# Patient Record
Sex: Female | Born: 1980 | Race: Black or African American | Hispanic: No | Marital: Single | Smoking: Never smoker
Health system: Southern US, Community
[De-identification: ages and names within clinical notes are randomized; demographics above are authoritative.]

## PROBLEM LIST (undated history)

## (undated) DIAGNOSIS — Z8639 Personal history of other endocrine, nutritional and metabolic disease: Secondary | ICD-10-CM

## (undated) HISTORY — PX: LAPAROSCOPIC GASTRIC SLEEVE RESECTION: SHX5895

---

## 2016-10-24 ENCOUNTER — Other Ambulatory Visit: Payer: Self-pay | Admitting: Surgical Oncology

## 2016-11-01 ENCOUNTER — Ambulatory Visit: Payer: Self-pay

## 2016-11-08 ENCOUNTER — Encounter: Payer: Self-pay | Admitting: Radiology

## 2016-11-08 ENCOUNTER — Ambulatory Visit
Admission: RE | Admit: 2016-11-08 | Discharge: 2016-11-08 | Disposition: A | Discharge status: Home / Self Care | Payer: Medicaid Other | Source: Ambulatory Visit | Attending: Surgical Oncology | Admitting: Surgical Oncology

## 2016-11-08 DIAGNOSIS — K449 Diaphragmatic hernia without obstruction or gangrene: Secondary | ICD-10-CM | POA: Insufficient documentation

## 2016-11-08 DIAGNOSIS — K219 Gastro-esophageal reflux disease without esophagitis: Secondary | ICD-10-CM | POA: Insufficient documentation

## 2018-05-07 ENCOUNTER — Ambulatory Visit
Admission: EM | Admit: 2018-05-07 | Discharge: 2018-05-07 | Disposition: A | Discharge status: Home / Self Care | Payer: BLUE CROSS/BLUE SHIELD | Attending: Family Medicine | Admitting: Family Medicine

## 2018-05-07 ENCOUNTER — Other Ambulatory Visit: Payer: Self-pay

## 2018-05-07 DIAGNOSIS — M79645 Pain in left finger(s): Secondary | ICD-10-CM

## 2018-05-07 DIAGNOSIS — M6740 Ganglion, unspecified site: Secondary | ICD-10-CM

## 2018-05-07 NOTE — Discharge Instructions (Signed)
Tylenol or Ibuprofen as needed.  Take care  Dr. Lacinda Axon

## 2018-05-07 NOTE — ED Provider Notes (Signed)
[facility]    CSN: [id number] Arrival date & time: 05/07/18  1906  History   Chief Complaint Chief Complaint  Patient presents with  . Hand Pain   HPI  37 year old female presents with the above complaint.  Patient reports that she noted some pain and a "knot" around the base of her left thumb earlier today.  Mild pain with range of motion.  No medications or interventions tried.  No known inciting factor.  No known relieving factors.  No other associated symptoms.  No other complaints.   PMH: History of morbid obesity, history of hyperthyroidism.  Prediabetes, elevated HDL.  Past Surgical History:  Procedure Laterality Date  . LAPAROSCOPIC GASTRIC SLEEVE RESECTION    D & C  OB History   None    Home Medications    Prior to Admission medications   Medication Sig Start Date End Date Taking? Authorizing Provider  levonorgestrel (MIRENA) 20 MCG/24HR IUD by Intrauterine route.   Yes [provider]    Family History Family History  Problem Relation Age of Onset  . Hypertension Mother     Social History Social History   Tobacco Use  . Smoking status: Never Smoker  . Smokeless tobacco: Never Used  Substance Use Topics  . Alcohol use: Never    Frequency: Never  . Drug use: Never     Allergies   Patient has no known allergies.   Review of Systems Review of Systems  Constitutional: Negative.   Musculoskeletal:       Pain, "knot" left hand.   Physical Exam Triage Vital Signs ED Triage Vitals  Enc Vitals Group     BP 05/07/18 1919 110/74     Pulse Rate 05/07/18 1919 (!) 53     Resp 05/07/18 1919 18     Temp 05/07/18 1919 98.5 F (36.9 C)     Temp Source 05/07/18 1919 Oral     SpO2 05/07/18 1919 100 %     Weight 05/07/18 1918 192 lb (87.1 kg)     Height 05/07/18 1918 5\' 3"  (1.6 m)     Head Circumference --      Peak Flow --      Pain Score 05/07/18 1917 6     Pain Loc --      Pain Edu? --      Excl. in [place]? --    Updated  Vital Signs BP 110/74 (BP Location: Left Arm)   Pulse (!) 53   Temp 98.5 F (36.9 C) (Oral)   Resp 18   Ht 5\' 3"  (1.6 m)   Wt 192 lb (87.1 kg)   SpO2 100%   BMI 34.01 kg/m   Physical Exam  Constitutional: She is oriented to person, place, and time. She appears well-developed. No distress.  HENT:  Head: Normocephalic and atraumatic.  Pulmonary/Chest: Effort normal. No respiratory distress.  Musculoskeletal:  Left hand -patient with a raised nodule around the [facility] joint.   firm, mobile.  Consistent with ganglion cyst.  Neurological: She is alert and oriented to person, place, and time.  Psychiatric: She has a normal mood and affect. Her behavior is normal.  Nursing note and vitals reviewed.  UC Treatments / Results  Labs (all labs ordered are listed, but only abnormal results are displayed) Labs Reviewed - No data to display  EKG None  Radiology No results found.  Procedures Procedures (including critical care time)  Medications Ordered in UC Medications - No data to display  Initial Impression / Assessment and Plan / UC Course  I have reviewed the triage vital signs and the nursing notes.  Pertinent labs & imaging results that were available during my care of the patient were reviewed by me and considered in my medical decision making (see chart for details).    37 year old female presents with a ganglion cyst.  Advised supportive care.  Tylenol and ibuprofen as needed.  Final Clinical Impressions(s) / UC Diagnoses   Final diagnoses:  Ganglion cyst     Discharge Instructions     Tylenol or Ibuprofen as needed.  Take care  Dr. Lacinda Axon    ED Prescriptions    None     Controlled Substance Prescriptions [place] Controlled Substance Registry consulted? Not Applicable   Coral Spikes, DO 05/07/18 1948

## 2018-05-07 NOTE — ED Triage Notes (Signed)
Patient complains of knot on her left hand that started around lunchtime and is sore with movement.

## 2018-05-31 ENCOUNTER — Ambulatory Visit: Payer: BLUE CROSS/BLUE SHIELD | Admitting: Anesthesiology

## 2018-05-31 ENCOUNTER — Encounter
Admission: RE | Disposition: A | Discharge status: Home / Self Care | Payer: Self-pay | Source: Ambulatory Visit | Attending: Unknown Physician Specialty

## 2018-05-31 ENCOUNTER — Ambulatory Visit
Admission: RE | Admit: 2018-05-31 | Discharge: 2018-05-31 | Disposition: A | Discharge status: Home / Self Care | Payer: BLUE CROSS/BLUE SHIELD | Source: Ambulatory Visit | Attending: Unknown Physician Specialty | Admitting: Unknown Physician Specialty

## 2018-05-31 DIAGNOSIS — Z6833 Body mass index (BMI) 33.0-33.9, adult: Secondary | ICD-10-CM | POA: Insufficient documentation

## 2018-05-31 DIAGNOSIS — Z79899 Other long term (current) drug therapy: Secondary | ICD-10-CM | POA: Diagnosis not present

## 2018-05-31 DIAGNOSIS — D573 Sickle-cell trait: Secondary | ICD-10-CM | POA: Insufficient documentation

## 2018-05-31 DIAGNOSIS — R2232 Localized swelling, mass and lump, left upper limb: Secondary | ICD-10-CM | POA: Diagnosis present

## 2018-05-31 DIAGNOSIS — Z9884 Bariatric surgery status: Secondary | ICD-10-CM | POA: Insufficient documentation

## 2018-05-31 DIAGNOSIS — D492 Neoplasm of unspecified behavior of bone, soft tissue, and skin: Secondary | ICD-10-CM | POA: Diagnosis not present

## 2018-05-31 HISTORY — DX: Personal history of other endocrine, nutritional and metabolic disease: Z86.39

## 2018-05-31 HISTORY — PX: MASS EXCISION: SHX2000

## 2018-05-31 SURGERY — EXCISION MASS
Anesthesia: General | Site: Hand | Laterality: Left | Wound class: Clean

## 2018-05-31 MED ORDER — ACETAMINOPHEN 160 MG/5ML PO SOLN: 325.0000 mg | ORAL | Status: DC | PRN

## 2018-05-31 MED ORDER — ONDANSETRON HCL 4 MG/2ML IJ SOLN: 4.0000 mg | Freq: Once | INTRAMUSCULAR | Status: DC | PRN

## 2018-05-31 MED ORDER — PROPOFOL 10 MG/ML IV BOLUS
INTRAVENOUS | Status: DC | PRN
  Administered 2018-05-31: 200 mg via INTRAVENOUS

## 2018-05-31 MED ORDER — LIDOCAINE HCL (CARDIAC) PF 100 MG/5ML IV SOSY
PREFILLED_SYRINGE | INTRAVENOUS | Status: DC | PRN
  Administered 2018-05-31: 30 mg via INTRATRACHEAL

## 2018-05-31 MED ORDER — BUPIVACAINE HCL 0.5 % IJ SOLN
INTRAMUSCULAR | Status: DC | PRN
  Administered 2018-05-31: 5 mL

## 2018-05-31 MED ORDER — OXYCODONE HCL 5 MG/5ML PO SOLN: 5.0000 mg | Freq: Once | ORAL | Status: AC | PRN

## 2018-05-31 MED ORDER — HYDROMORPHONE HCL 1 MG/ML IJ SOLN: 0.2500 mg | INTRAMUSCULAR | Status: DC | PRN

## 2018-05-31 MED ORDER — DEXAMETHASONE SODIUM PHOSPHATE 4 MG/ML IJ SOLN
INTRAMUSCULAR | Status: DC | PRN
  Administered 2018-05-31: 4 mg via INTRAVENOUS

## 2018-05-31 MED ORDER — MIDAZOLAM HCL 5 MG/5ML IJ SOLN
INTRAMUSCULAR | Status: DC | PRN
  Administered 2018-05-31 (×2): 1 mg via INTRAVENOUS

## 2018-05-31 MED ORDER — ONDANSETRON HCL 4 MG/2ML IJ SOLN
INTRAMUSCULAR | Status: DC | PRN
  Administered 2018-05-31: 4 mg via INTRAVENOUS

## 2018-05-31 MED ORDER — OXYCODONE HCL 5 MG PO TABS
5.0000 mg | ORAL_TABLET | Freq: Once | ORAL | Status: AC | PRN
  Administered 2018-05-31: 5 mg via ORAL

## 2018-05-31 MED ORDER — NORCO 5-325 MG PO TABS: 1.0000 | ORAL_TABLET | Freq: Four times a day (QID) | ORAL | 0 refills | Status: AC | PRN

## 2018-05-31 MED ORDER — FENTANYL CITRATE (PF) 100 MCG/2ML IJ SOLN
INTRAMUSCULAR | Status: DC | PRN
  Administered 2018-05-31 (×2): 25 ug via INTRAVENOUS
  Administered 2018-05-31: 50 ug via INTRAVENOUS

## 2018-05-31 MED ORDER — ACETAMINOPHEN 325 MG PO TABS: 325.0000 mg | ORAL_TABLET | ORAL | Status: DC | PRN

## 2018-05-31 MED ORDER — GLYCOPYRROLATE 0.2 MG/ML IJ SOLN
INTRAMUSCULAR | Status: DC | PRN
  Administered 2018-05-31: 0.1 mg via INTRAVENOUS

## 2018-05-31 MED ORDER — LACTATED RINGERS IV SOLN
INTRAVENOUS | Status: DC
  Administered 2018-05-31: 08:00:00 via INTRAVENOUS

## 2018-05-31 SURGICAL SUPPLY — 28 items
BANDAGE ELASTIC 2 LF NS (GAUZE/BANDAGES/DRESSINGS) ×3 IMPLANT
BNDG COHESIVE 4X5 TAN STRL (GAUZE/BANDAGES/DRESSINGS) ×3 IMPLANT
BNDG ESMARK 4X12 TAN STRL LF (GAUZE/BANDAGES/DRESSINGS) ×3 IMPLANT
CANISTER SUCT 1200ML W/VALVE (MISCELLANEOUS) IMPLANT
COVER LIGHT HANDLE UNIVERSAL (MISCELLANEOUS) ×6 IMPLANT
DURAPREP 26ML APPLICATOR (WOUND CARE) ×3 IMPLANT
ELECT REM PT RETURN 9FT ADLT (ELECTROSURGICAL) ×3
ELECTRODE REM PT RTRN 9FT ADLT (ELECTROSURGICAL) ×1 IMPLANT
GAUZE PETRO XEROFOAM 1X8 (MISCELLANEOUS) ×3 IMPLANT
GAUZE SPONGE 4X4 12PLY STRL (GAUZE/BANDAGES/DRESSINGS) ×3 IMPLANT
GLOVE BIO SURGEON STRL SZ7.5 (GLOVE) ×6 IMPLANT
GLOVE BIO SURGEON STRL SZ8 (GLOVE) ×3 IMPLANT
GLOVE INDICATOR 8.0 STRL GRN (GLOVE) ×6 IMPLANT
GOWN STRL REUS W/ TWL LRG LVL3 (GOWN DISPOSABLE) ×2 IMPLANT
GOWN STRL REUS W/TWL LRG LVL3 (GOWN DISPOSABLE) ×4
HEMOSTAT SURGICEL 2X3 (HEMOSTASIS) ×3 IMPLANT
KIT TURNOVER KIT A (KITS) ×3 IMPLANT
NS IRRIG 500ML POUR BTL (IV SOLUTION) ×3 IMPLANT
PACK EXTREMITY ARMC (MISCELLANEOUS) ×3 IMPLANT
PADDING CAST 2X4YD ST (MISCELLANEOUS) ×2
PADDING CAST BLEND 2X4 STRL (MISCELLANEOUS) ×1 IMPLANT
PENCIL SMOKE EVACUATOR (MISCELLANEOUS) ×3 IMPLANT
STOCKINETTE IMPERVIOUS 9X36 MD (GAUZE/BANDAGES/DRESSINGS) ×3 IMPLANT
SUT ETHILON 3-0 FS-10 30 BLK (SUTURE)
SUT ETHILON 4 0 FS (SUTURE) ×3 IMPLANT
SUT VIC AB 3-0 SH 27 (SUTURE) ×2
SUT VIC AB 3-0 SH 27X BRD (SUTURE) ×1 IMPLANT
SUTURE EHLN 3-0 FS-10 30 BLK (SUTURE) IMPLANT

## 2018-05-31 NOTE — Transfer of Care (Signed)
Immediate Anesthesia Transfer of Care Note  Patient: Alexa Baker  Procedure(s) Performed: EXCISION MASS  HAND (Left Hand)  Patient Location: PACU  Anesthesia Type: General  Level of Consciousness: awake, alert  and patient cooperative  Airway and Oxygen Therapy: Patient Spontanous Breathing and Patient connected to supplemental oxygen  Post-op Assessment: Post-op Vital signs reviewed, Patient's Cardiovascular Status Stable, Respiratory Function Stable, Patent Airway and No signs of Nausea or vomiting  Post-op Vital Signs: Reviewed and stable  Complications: No apparent anesthesia complications

## 2018-05-31 NOTE — Anesthesia Preprocedure Evaluation (Addendum)
Anesthesia Evaluation  Patient identified by MRN, date of birth, ID band Patient awake    Reviewed: Allergy & Precautions, H&P , NPO status , Patient's Chart, lab work & pertinent test results, reviewed documented beta blocker date and time   Airway Mallampati: I  TM Distance: >3 FB Neck ROM: full    Dental no notable dental hx.    Pulmonary neg pulmonary ROS,    Pulmonary exam normal breath sounds clear to auscultation       Cardiovascular Exercise Tolerance: Good negative cardio ROS Normal cardiovascular exam Rhythm:regular Rate:Normal     Neuro/Psych negative neurological ROS  negative psych ROS   GI/Hepatic negative GI ROS, Neg liver ROS,   Endo/Other  negative endocrine ROS  Renal/GU negative Renal ROS  negative genitourinary   Musculoskeletal   Abdominal   Peds  Hematology negative hematology ROS (+)   Anesthesia Other Findings   Reproductive/Obstetrics negative OB ROS                             Anesthesia Physical Anesthesia Plan  ASA: II  Anesthesia Plan: General   Post-op Pain Management:    Induction:   PONV Risk Score and Plan:   Airway Management Planned:   Additional Equipment:   Intra-op Plan:   Post-operative Plan:   Informed Consent: I have reviewed the patients History and Physical, chart, labs and discussed the procedure including the risks, benefits and alternatives for the proposed anesthesia with the patient or authorized representative who has indicated his/her understanding and acceptance.   Dental Advisory Given  Plan Discussed with: CRNA and Anesthesiologist  Anesthesia Plan Comments:        Anesthesia Quick Evaluation

## 2018-05-31 NOTE — Anesthesia Postprocedure Evaluation (Signed)
Anesthesia Post Note  Patient: Alexa Baker  Procedure(s) Performed: EXCISION MASS  HAND (Left Hand)  Patient location during evaluation: PACU Anesthesia Type: General Level of consciousness: awake and alert Pain management: pain level controlled Vital Signs Assessment: post-procedure vital signs reviewed and stable Respiratory status: spontaneous breathing, nonlabored ventilation, respiratory function stable and patient connected to nasal cannula oxygen Cardiovascular status: blood pressure returned to baseline and stable Postop Assessment: no apparent nausea or vomiting Anesthetic complications: no    Trecia Rogers

## 2018-05-31 NOTE — Discharge Instructions (Signed)
Ice pack  Elevation  RTC in about 10 days    General Anesthesia, Adult, Care After These instructions provide you with information about caring for yourself after your procedure. Your health care provider may also give you more specific instructions. Your treatment has been planned according to current medical practices, but problems sometimes occur. Call your health care provider if you have any problems or questions after your procedure. What can I expect after the procedure? After the procedure, it is common to have:  Vomiting.  A sore throat.  Mental slowness.  It is common to feel:  Nauseous.  Cold or shivery.  Sleepy.  Tired.  Sore or achy, even in parts of your body where you did not have surgery.  Follow these instructions at home: For at least 24 hours after the procedure:  Do not: ? Participate in activities where you could fall or become injured. ? Drive. ? Use heavy machinery. ? Drink alcohol. ? Take sleeping pills or medicines that cause drowsiness. ? Make important decisions or sign legal documents. ? Take care of children on your own.  Rest. Eating and drinking  If you vomit, drink water, juice, or soup when you can drink without vomiting.  Drink enough fluid to keep your urine clear or pale yellow.  Make sure you have little or no nausea before eating solid foods.  Follow the diet recommended by your health care provider. General instructions  Have a responsible adult stay with you until you are awake and alert.  Return to your normal activities as told by your health care provider. Ask your health care provider what activities are safe for you.  Take over-the-counter and prescription medicines only as told by your health care provider.  If you smoke, do not smoke without supervision.  Keep all follow-up visits as told by your health care provider. This is important. Contact a health care provider if:  You continue to have nausea or  vomiting at home, and medicines are not helpful.  You cannot drink fluids or start eating again.  You cannot urinate after 8-12 hours.  You develop a skin rash.  You have fever.  You have increasing redness at the site of your procedure. Get help right away if:  You have difficulty breathing.  You have chest pain.  You have unexpected bleeding.  You feel that you are having a life-threatening or urgent problem. This information is not intended to replace advice given to you by your health care provider. Make sure you discuss any questions you have with your health care provider. Document Released: 02/26/2001 Document Revised: 04/24/2016 Document Reviewed: 11/04/2015 Elsevier Interactive Patient Education  [employer].

## 2018-05-31 NOTE — Op Note (Signed)
05/31/2018  10:19 AM  PATIENT:  Alexa Baker  37 y.o. female  PRE-OPERATIVE DIAGNOSIS:  MASS left thumb  POST-OPERATIVE DIAGNOSIS:  MASS left thumb2  PROCEDURE:  Procedure(s): EXCISION MASS  HAND (Left)  SURGEON:   Leanor Kail, Brooke Bonito., MD  ANESTHESIA: General  HISTORY: The patient had about a 4-week history of an enlarging mass on the dorsum of her left thumb.   She was brought in for excision of the mass under general anesthesia.  OP NOTE: Patient was taken to the operating room where satisfactory general anesthesia was achieved.  Tourniquet was applied to the left upper arm.  The left upper extremity was prepped and draped in usual fashion for procedure about the hand.  The left upper extremity was exsanguinated and the tourniquet was inflated.  About a 2-1/2 cm transverse incision was made over the midportion of the mass.  The mass was located just dorsal to the [facility] joint of the left thumb.  I bluntly dissected down to the mass.  The mass was somewhat ill-defined.  There was some nodularity to it but it also was spread in a linear fashion for about 2 cm.  It did seem to be intertwined with some of the small vessels overlying the left thumb [facility] joint.  I  was able to gradually sharply dissect the mass away from its soft tissue entanglements.  Once I felt the mass was completely excised the tourniquet was released.  It was up about 20 minutes.  Bleeding was controlled with coagulation cautery.  The subcu was closed with 3-0 Vicryl.  The skin was closed with 4-0 nylon sutures in vertical mattress fashion.  I did inject about 5 cc of half percent Marcaine without epinephrine about the incision and just proximal to it.  Dressing was applied.  The patient was awakened and transferred to her stretcher bed.  She was taken to the recovery room in satisfactory condition.  Blood loss was negligible.

## 2018-05-31 NOTE — Anesthesia Procedure Notes (Addendum)
Procedure Name: LMA Insertion Date/Time: 05/31/2018 8:57 AM Performed by: Cameron Ali, CRNA Pre-anesthesia Checklist: Patient identified, Emergency Drugs available, Suction available, Timeout performed and Patient being monitored Patient Re-evaluated:Patient Re-evaluated prior to induction Oxygen Delivery Method: Circle system utilized Preoxygenation: Pre-oxygenation with 100% oxygen Induction Type: IV induction LMA: LMA inserted LMA Size: 4.0 Number of attempts: 1 Placement Confirmation: positive ETCO2 and breath sounds checked- equal and bilateral Tube secured with: Tape Dental Injury: Teeth and Oropharynx as per pre-operative assessment

## 2018-05-31 NOTE — H&P (Signed)
  H and P reviewed. No changes. Uploaded at later date. 

## 2018-06-04 ENCOUNTER — Encounter: Payer: Self-pay | Admitting: Unknown Physician Specialty

## 2018-06-10 LAB — SURGICAL PATHOLOGY

## 2019-01-22 ENCOUNTER — Other Ambulatory Visit: Payer: Self-pay

## 2019-01-22 ENCOUNTER — Encounter: Payer: Self-pay | Admitting: Emergency Medicine

## 2019-01-22 ENCOUNTER — Ambulatory Visit
Admission: EM | Admit: 2019-01-22 | Discharge: 2019-01-22 | Disposition: A | Discharge status: Home / Self Care | Payer: Managed Care, Other (non HMO) | Attending: Family Medicine | Admitting: Family Medicine

## 2019-01-22 DIAGNOSIS — M542 Cervicalgia: Secondary | ICD-10-CM

## 2019-01-22 MED ORDER — METAXALONE 800 MG PO TABS: 800.0000 mg | ORAL_TABLET | Freq: Three times a day (TID) | ORAL | 0 refills | Status: DC | PRN

## 2019-01-22 MED ORDER — MELOXICAM 15 MG PO TABS: 15.0000 mg | ORAL_TABLET | Freq: Every day | ORAL | 0 refills | Status: DC | PRN

## 2019-01-22 NOTE — ED Triage Notes (Signed)
Pt c/o left shoulder pain and neck pain. Started about  2 weeks ago. She states that it is worse when she lays down. No known injury. She has tried heating pad and tylenol.

## 2019-01-22 NOTE — Discharge Instructions (Signed)
Rest.   Heat/Hot bath.  Medication as prescribed.  Take care  Dr. Lacinda Axon

## 2019-01-22 NOTE — ED Provider Notes (Signed)
[facility]    CSN: [id number] Arrival date & time: 01/22/19  9767  History   Chief Complaint Chief Complaint  Patient presents with  . Neck Pain  . Shoulder Pain    left   HPI  38 year old female presents with the above complaints.  Patient reports that she has had left-sided neck pain/trapezius pain for the past 2 weeks.  Has been steadily getting worse.  She is tried over-the-counter medications without improvement.  Worse with range of motion particularly and left rotation.  Denies fall, trauma, injury.  No known relieving factors.  No radicular symptoms.  No other complaints.  PMH, Surgical Hx, Family Hx, Social History reviewed and updated as below.  Past Medical History:  Diagnosis Date  . History of hyperthyroidism    Past Surgical History:  Procedure Laterality Date  . LAPAROSCOPIC GASTRIC SLEEVE RESECTION    . MASS EXCISION Left 05/31/2018   Procedure: EXCISION MASS  HAND;  Surgeon: Leanor Kail, MD;  Location: [place];  Service: Orthopedics;  Laterality: Left;   OB History   No obstetric history on file.    Home Medications    Prior to Admission medications   Medication Sig Start Date End Date Taking? Authorizing Provider  levonorgestrel (MIRENA) 20 MCG/24HR IUD by Intrauterine route.   Yes [provider]  meloxicam (MOBIC) 15 MG tablet Take 1 tablet (15 mg total) by mouth daily as needed for pain. 01/22/19   Coral Spikes, DO  metaxalone (SKELAXIN) 800 MG tablet Take 1 tablet (800 mg total) by mouth 3 (three) times daily as needed for muscle spasms. 01/22/19   Coral Spikes, DO   Family History Family History  Problem Relation Age of Onset  . Hypertension Mother    Social History Social History   Tobacco Use  . Smoking status: Never Smoker  . Smokeless tobacco: Never Used  Substance Use Topics  . Alcohol use: Never    Frequency: Never  . Drug use: Never   Allergies   Patient has no known allergies.  Review  of Systems Review of Systems  Constitutional: Negative.   Musculoskeletal: Positive for neck pain.   Physical Exam Triage Vital Signs ED Triage Vitals  Enc Vitals Group     BP 01/22/19 1017 104/73     Pulse Rate 01/22/19 1017 (!) 57     Resp 01/22/19 1017 18     Temp 01/22/19 1017 98 F (36.7 C)     Temp Source 01/22/19 1017 Oral     SpO2 01/22/19 1017 100 %     Weight 01/22/19 1015 197 lb (89.4 kg)     Height 01/22/19 1015 5\' 2"  (1.575 m)     Head Circumference --      Peak Flow --      Pain Score 01/22/19 1014 10     Pain Loc --      Pain Edu? --      Excl. in [place]? --    Updated Vital Signs BP 104/73 (BP Location: Left Arm)   Pulse (!) 57   Temp 98 F (36.7 C) (Oral)   Resp 18   Ht 5\' 2"  (1.575 m)   Wt 89.4 kg   SpO2 100%   BMI 36.03 kg/m   Visual Acuity Right Eye Distance:   Left Eye Distance:   Bilateral Distance:    Right Eye Near:   Left Eye Near:    Bilateral Near:     Physical Exam Vitals  signs and nursing note reviewed.  Constitutional:      General: She is not in acute distress.    Appearance: Normal appearance.  HENT:     Head: Normocephalic and atraumatic.  Eyes:     General:        Right eye: No discharge.        Left eye: No discharge.     Conjunctiva/sclera: Conjunctivae normal.  Neck:     Comments: Patient with left-sided trapezius spasm.  Decreased range of motion of the neck in left rotation. Cardiovascular:     Rate and Rhythm: Normal rate and regular rhythm.  Pulmonary:     Effort: Pulmonary effort is normal.     Breath sounds: Normal breath sounds.  Neurological:     Mental Status: She is alert.  Psychiatric:        Mood and Affect: Mood normal.        Behavior: Behavior normal.    UC Treatments / Results  Labs (all labs ordered are listed, but only abnormal results are displayed) Labs Reviewed - No data to display  EKG None  Radiology No results found.  Procedures Procedures (including critical care  time)  Medications Ordered in UC Medications - No data to display  Initial Impression / Assessment and Plan / UC Course  I have reviewed the triage vital signs and the nursing notes.  Pertinent labs & imaging results that were available during my care of the patient were reviewed by me and considered in my medical decision making (see chart for details).    38 year old female presents with neck pain.  Treating with meloxicam and Skelaxin.  Advised heat.  Supportive care.  Final Clinical Impressions(s) / UC Diagnoses   Final diagnoses:  Neck pain     Discharge Instructions     Rest.   Heat/Hot bath.  Medication as prescribed.  Take care  Dr. Lacinda Axon    ED Prescriptions    Medication Sig Dispense Auth. Provider   metaxalone (SKELAXIN) 800 MG tablet Take 1 tablet (800 mg total) by mouth 3 (three) times daily as needed for muscle spasms. 30 tablet Anahis Furgeson G, DO   meloxicam (MOBIC) 15 MG tablet Take 1 tablet (15 mg total) by mouth daily as needed for pain. 30 tablet Coral Spikes, DO     Controlled Substance Prescriptions  Controlled Substance Registry consulted? Not Applicable   Coral Spikes, DO 01/22/19 1115

## 2019-03-18 ENCOUNTER — Encounter: Payer: Self-pay | Admitting: Emergency Medicine

## 2019-03-18 ENCOUNTER — Other Ambulatory Visit: Payer: Self-pay

## 2019-03-18 ENCOUNTER — Ambulatory Visit
Admission: EM | Admit: 2019-03-18 | Discharge: 2019-03-18 | Disposition: A | Discharge status: Home / Self Care | Payer: Managed Care, Other (non HMO) | Attending: Family Medicine | Admitting: Family Medicine

## 2019-03-18 DIAGNOSIS — L42 Pityriasis rosea: Secondary | ICD-10-CM | POA: Diagnosis not present

## 2019-03-18 MED ORDER — HYDROXYZINE HCL 25 MG PO TABS: 25.0000 mg | ORAL_TABLET | Freq: Three times a day (TID) | ORAL | 0 refills | Status: DC | PRN

## 2019-03-18 NOTE — ED Triage Notes (Signed)
Pt c/o rash on her abdomen. She has white, scaly patches  She states that they itch intermittently. Started about a week ago. No changes in detergents, soaps, medications.

## 2019-03-18 NOTE — Discharge Instructions (Addendum)
Take medication as prescribed. Rest. Drink plenty of fluids. Topical hydrocortisone as needed.   Follow up with your primary care physician this week as needed. Return to Urgent care for new or worsening concerns.

## 2019-03-18 NOTE — ED Provider Notes (Signed)
[facility] ____________________________________________  Time seen: Approximately 6:21 PM  I have reviewed the triage vital signs and the nursing notes.   HISTORY  Chief Complaint Rash   HPI Alexa Baker is a 38 y.o. female presenting for evaluation of rash present to torso for 1 week.  States rash is somewhat itchy.  Denies pain, drainage or discomfort.  Denies known trigger.  Denies any changes in foods, medicines, lotions, detergents or other contacts.  Denies others in household with similar.  Denies insect bites.  Reports otherwise feels well.  Denies cough, congestion, chest pain, shortness of breath, fevers or recent sickness.  Denies aggravating alleviating factors.  Reports otherwise doing well.  No LMP recorded. (Menstrual status: IUD).  Denies pregnancy    Past Medical History:  Diagnosis Date  . History of hyperthyroidism     There are no active problems to display for this patient.   Past Surgical History:  Procedure Laterality Date  . LAPAROSCOPIC GASTRIC SLEEVE RESECTION    . MASS EXCISION Left 05/31/2018   Procedure: EXCISION MASS  HAND;  Surgeon: Leanor Kail, MD;  Location: [place];  Service: Orthopedics;  Laterality: Left;     No current facility-administered medications for this encounter.   Current Outpatient Medications:  .  levonorgestrel (MIRENA) 20 MCG/24HR IUD, by Intrauterine route., Disp: , Rfl:  .  hydrOXYzine (ATARAX/VISTARIL) 25 MG tablet, Take 1 tablet (25 mg total) by mouth 3 (three) times daily as needed for itching., Disp: 15 tablet, Rfl: 0  Allergies Patient has no known allergies.  Family History  Problem Relation Age of Onset  . Hypertension Mother     Social History Social History   Tobacco Use  . Smoking status: Never Smoker  . Smokeless tobacco: Never Used  Substance Use Topics  . Alcohol use: Never    Frequency: Never  . Drug use: Never    Review of Systems Constitutional: No fever  Cardiovascular: Denies chest pain. Respiratory: Denies shortness of breath. Gastrointestinal: No abdominal pain.  Musculoskeletal: Negative for back pain. Skin: Positive rash.  ____________________________________________   PHYSICAL EXAM:  VITAL SIGNS: ED Triage Vitals  Enc Vitals Group     BP 03/18/19 1754 109/71     Pulse Rate 03/18/19 1754 (!) 55     Resp 03/18/19 1754 16     Temp 03/18/19 1754 98.1 F (36.7 C)     Temp Source 03/18/19 1754 Oral     SpO2 03/18/19 1754 100 %     Weight 03/18/19 1752 202 lb (91.6 kg)     Height 03/18/19 1752 5\' 2"  (1.575 m)     Head Circumference --      Peak Flow --      Pain Score 03/18/19 1751 0     Pain Loc --      Pain Edu? --      Excl. in [place]? --     Constitutional: Alert and oriented. Well appearing and in no acute distress. ENT      Head: Normocephalic and atraumatic. Cardiovascular: Normal rate, regular rhythm. Grossly normal heart sounds.  Good peripheral circulation. Respiratory: Normal respiratory effort without tachypnea nor retractions. Breath sounds are clear and equal bilaterally. No wheezes, rales, rhonchi. Gastrointestinal: Soft and nontender. Musculoskeletal: Steady gait Neurologic:  Normal speech and language.Speech is normal. No gait instability.  Skin:  Skin is warm, dry.  Except: Numerous scattered ovular shaped mildly erythematous and scaly plaques less than 1 cm scattered to anterior and posterior torso,  no other rash noted, not present to palms, not present to face, nontender, no edema. Psychiatric: Mood and affect are normal. Speech and behavior are normal. Patient exhibits appropriate insight and judgment   ___________________________________________   LABS (all labs ordered are listed, but only abnormal results are displayed)  Labs Reviewed - No data to display ____________________________________________   PROCEDURES Procedures   INITIAL IMPRESSION / ASSESSMENT AND PLAN / ED COURSE  Pertinent  labs & imaging results that were available during my care of the patient were reviewed by me and considered in my medical decision making (see chart for details).  Very well-appearing patient.  No acute distress.  Rash clinical appearance consistent with pityriasis rosea.  Encouraged supportive care.  PRN hydroxyzine as needed for itching.  Over-the-counter topical hydrocortisone as needed.  Monitor.Discussed indication, risks and benefits of medications with patient.  Discussed follow up with Primary care physician this week as needed. Discussed follow up and return parameters including no resolution or any worsening concerns. Patient verbalized understanding and agreed to plan.   ____________________________________________   FINAL CLINICAL IMPRESSION(S) / ED DIAGNOSES  Final diagnoses:  Pityriasis rosea     ED Discharge Orders         Ordered    hydrOXYzine (ATARAX/VISTARIL) 25 MG tablet  3 times daily PRN     03/18/19 1806           Note: This dictation was prepared with Dragon dictation along with smaller phrase technology. Any transcriptional errors that result from this process are unintentional.         Marylene Land, NP 03/18/19 1824

## 2019-03-30 ENCOUNTER — Telehealth: Payer: Self-pay | Admitting: Family Medicine

## 2019-03-30 MED ORDER — TRIAMCINOLONE ACETONIDE 0.1 % EX OINT: 1.0000 "application " | TOPICAL_OINTMENT | Freq: Two times a day (BID) | CUTANEOUS | 0 refills | Status: DC

## 2019-03-30 MED ORDER — ACYCLOVIR 400 MG PO TABS: 400.0000 mg | ORAL_TABLET | Freq: Every day | ORAL | 0 refills | Status: AC

## 2019-03-30 NOTE — Telephone Encounter (Signed)
Patient remains symptomatic from rash (encouter states pityriasis rosea). Starting on acyclovir.  Triamcinolone as needed.

## 2019-09-03 ENCOUNTER — Ambulatory Visit
Admission: EM | Admit: 2019-09-03 | Discharge: 2019-09-03 | Disposition: A | Discharge status: Home / Self Care | Payer: Managed Care, Other (non HMO) | Attending: Urgent Care | Admitting: Urgent Care

## 2019-09-03 ENCOUNTER — Encounter: Payer: Self-pay | Admitting: Emergency Medicine

## 2019-09-03 ENCOUNTER — Other Ambulatory Visit: Payer: Self-pay | Admitting: Family Medicine

## 2019-09-03 ENCOUNTER — Other Ambulatory Visit: Payer: Self-pay

## 2019-09-03 DIAGNOSIS — N898 Other specified noninflammatory disorders of vagina: Secondary | ICD-10-CM | POA: Diagnosis not present

## 2019-09-03 DIAGNOSIS — B9689 Other specified bacterial agents as the cause of diseases classified elsewhere: Secondary | ICD-10-CM | POA: Diagnosis not present

## 2019-09-03 DIAGNOSIS — Z202 Contact with and (suspected) exposure to infections with a predominantly sexual mode of transmission: Secondary | ICD-10-CM

## 2019-09-03 DIAGNOSIS — N760 Acute vaginitis: Secondary | ICD-10-CM

## 2019-09-03 DIAGNOSIS — Z1231 Encounter for screening mammogram for malignant neoplasm of breast: Secondary | ICD-10-CM

## 2019-09-03 LAB — URINALYSIS, COMPLETE (UACMP) WITH MICROSCOPIC
Bacteria, UA: NONE SEEN
Bilirubin Urine: NEGATIVE
Glucose, UA: NEGATIVE mg/dL
Hgb urine dipstick: NEGATIVE
Ketones, ur: NEGATIVE mg/dL
Leukocytes,Ua: NEGATIVE
Nitrite: NEGATIVE
Protein, ur: NEGATIVE mg/dL
Specific Gravity, Urine: 1.015 (ref 1.005–1.030)
pH: 7 (ref 5.0–8.0)

## 2019-09-03 LAB — WET PREP, GENITAL
Sperm: NONE SEEN
Trich, Wet Prep: NONE SEEN
Yeast Wet Prep HPF POC: NONE SEEN

## 2019-09-03 MED ORDER — FLUCONAZOLE 150 MG PO TABS: ORAL_TABLET | ORAL | 0 refills | Status: DC

## 2019-09-03 MED ORDER — TINIDAZOLE 500 MG PO TABS: 2.0000 g | ORAL_TABLET | Freq: Every day | ORAL | 0 refills | Status: AC

## 2019-09-03 MED ORDER — AZITHROMYCIN 500 MG PO TABS
1000.0000 mg | ORAL_TABLET | Freq: Once | ORAL | Status: AC
  Administered 2019-09-03: 11:00:00 1000 mg via ORAL

## 2019-09-03 MED ORDER — CEFTRIAXONE SODIUM 250 MG IJ SOLR
250.0000 mg | Freq: Once | INTRAMUSCULAR | Status: AC
  Administered 2019-09-03: 250 mg via INTRAMUSCULAR

## 2019-09-03 NOTE — Discharge Instructions (Signed)
It was very nice seeing you today in clinic. Thank you for entrusting me with your care.   Please utilize the medications that we discussed. Your prescriptions have been called in to your pharmacy.   Make arrangements to follow up with your regular doctor or GYN in 1 week for re-evaluation if not improving. If your symptoms/condition worsens, please seek follow up care either here or in the ER. Please remember, our [place] providers are "right here with you" when you need [place].   Again, it was my pleasure to take care of you today. Thank you for choosing our clinic. I hope that you start to feel better quickly.   Honor Loh, MSN, APRN, FNP-C, CEN Advanced Practice Provider [facility]

## 2019-09-03 NOTE — ED Provider Notes (Signed)
Alexa Baker, [place]   Name: Alexa Baker DOB: November 21, 1981 MRN: [id number] CSN: [id number] PCP: Zoila Shutter, MD  Arrival date and time:  09/03/19 0959  Chief Complaint:  Vaginal Discharge   NOTE: Prior to seeing the patient today, I have reviewed the triage nursing documentation and vital signs. Clinical staff has updated patient's PMH/PSHx, current medication list, and drug allergies/intolerances to ensure comprehensive history available to assist in medical decision making.   History:   HPI: Alexa Baker is a 38 y.o. female who presents today with complaints of vaginal discharge that began approximately a week ago. Discharge is reported to be yellow/green in color. Patient describes the discharge has having a foul odor. She denies any associated vaginal/pelvic pain. She has not appreciated any bleeding. Patient has not experienced any concurrent urinary symptoms; no dysuria, frequency, urgency, or gross hematuria. Patient denies any associated nausea, vomiting, fever/chills, or pain in her lower back, flank area, or abdomen. Patient advises that she does not have a significant history for STIs in the past, however she frequently gets bacterial vaginosis (BV). Patient endorses that she engages in unprotected sexual activity. She is concerned about STIs and presents today requesting testing and prophylactic treatment. She is unsure of her LMP as she has an IUD in place. There are no concerns that she is currently pregnant.   Past Medical History:  Diagnosis Date   History of hyperthyroidism     Past Surgical History:  Procedure Laterality Date   LAPAROSCOPIC GASTRIC SLEEVE RESECTION     MASS EXCISION Left 05/31/2018   Procedure: EXCISION MASS  HAND;  Surgeon: Leanor Kail, MD;  Location: [facility];  Service: Orthopedics;  Laterality: Left;    Family History  Problem Relation Age of Onset   Hypertension Mother     Social History   Tobacco Use   Smoking status:  Never Smoker   Smokeless tobacco: Never Used  Substance Use Topics   Alcohol use: Never    Frequency: Never   Drug use: Never    There are no active problems to display for this patient.   Home Medications:    Current Meds  Medication Sig   cyanocobalamin (,VITAMIN B-12,) 1000 MCG/ML injection Inject into the muscle.   levonorgestrel (MIRENA) 20 MCG/24HR IUD by Intrauterine route.    Allergies:   Patient has no known allergies.  Review of Systems (ROS): Review of Systems  Constitutional: Negative for chills and fever.  Respiratory: Negative for cough and shortness of breath.   Cardiovascular: Negative for chest pain and palpitations.  Gastrointestinal: Negative for abdominal pain, nausea and vomiting.  Genitourinary: Positive for vaginal discharge. Negative for decreased urine volume, dyspareunia, dysuria, flank pain, frequency, hematuria, pelvic pain, urgency, vaginal bleeding and vaginal pain.  Musculoskeletal: Negative for back pain.  Skin: Negative for color change, pallor and rash.  Neurological: Negative for dizziness, syncope, weakness and headaches.  All other systems reviewed and are negative.    Vital Signs: Today's Vitals   09/03/19 1008 09/03/19 1011 09/03/19 1130  BP:  120/80   Pulse:  (!) 56   Resp:  18   Temp:  98.3 F (36.8 C)   TempSrc:  Oral   SpO2:  100%   Weight: 209 lb (94.8 kg)    Height: 5\' 3"  (1.6 m)    PainSc: 0-No pain  0-No pain    Physical Exam: Physical Exam  Constitutional: She is oriented to person, place, and time and well-developed, well-nourished, and in no distress.  HENT:  Head: Normocephalic and atraumatic.  Mouth/Throat: Mucous membranes are normal.  Eyes: Pupils are equal, round, and reactive to light. EOM are normal.  Neck: Normal range of motion. Neck supple. No tracheal deviation present.  Cardiovascular: Normal rate, regular rhythm, normal heart sounds and intact distal pulses. Exam reveals no gallop and no  friction rub.  No murmur heard. Pulmonary/Chest: Effort normal and breath sounds normal. No respiratory distress. She has no wheezes. She has no rales.  Abdominal: Soft. Normal appearance and bowel sounds are normal. She exhibits no distension. There is abdominal tenderness in the suprapubic area. There is no CVA tenderness.  Genitourinary:    Genitourinary Comments: Exam deferred. No vaginal/pelvic pain or bleeding. Patient is not currently pregnant. She has elected to self collect specimen swab for wet prep and DNA probe for GC.   Neurological: She is alert and oriented to person, place, and time. Gait normal.  Skin: Skin is warm and dry. No rash noted.  Psychiatric: Mood, memory, affect and judgment normal.  Nursing note and vitals reviewed.   Urgent Care Treatments / Results:   LABS: PLEASE NOTE: all labs that were ordered this encounter are listed, however only abnormal results are displayed. Labs Reviewed  WET PREP, GENITAL - Abnormal; Notable for the following components:      Result Value   Clue Cells Wet Prep HPF POC PRESENT (*)    WBC, Wet Prep HPF POC FEW (*)    All other components within normal limits  GC/CHLAMYDIA PROBE AMP  URINALYSIS, COMPLETE (UACMP) WITH MICROSCOPIC    EKG: -None  RADIOLOGY: No results found.  PROCEDURES: Procedures  MEDICATIONS RECEIVED THIS VISIT: Medications  cefTRIAXone (ROCEPHIN) injection 250 mg (250 mg Intramuscular Given 09/03/19 1107)  azithromycin (ZITHROMAX) tablet 1,000 mg (1,000 mg Oral Given 09/03/19 1108)    PERTINENT CLINICAL COURSE NOTES/UPDATES:   Initial Impression / Assessment and Plan / Urgent Care Course:  Pertinent labs & imaging results that were available during my care of the patient were personally reviewed by me and considered in my medical decision making (see lab/imaging section of note for values and interpretations).  [name] is a 38 y.o. female who presents to [facility] today with  complaints of Vaginal Discharge   Patient is well appearing overall in clinic today. She does not appear to be in any acute distress. Presenting symptoms (see HPI) and exam as documented above. UA (-) for infection. GC probe sent for testing. Patient does not wish to wait on results citing that her discharge is malodorous, green, and different than anything she has ever experienced. Will proceed with prophylactic treatment for GC today. Patient given azithromycin 1 gram PO and ceftriaxone 250 mg IM in clinic today. Wet prep (+) for clue cells, which is consistent with bacterial vaginosis (BV) infection. Patient wishes to avoid oral metronidazole citing that "the pills taste so bad". She refuses Metrogel citing that she has that at home. In fact, patient used it x 1 application last night. She states, "I have used it so much I don't think it works anymore". Will treat with a 2 day course of oral tinidazole. Patient encouraged to complete the entire course of antibiotics even if she begins to feel better. Patient encouraged to increase her fluid intake as much as possible. Discussed that water is always best to flush the urinary tract and prevent development of a urinary tract infection while on treatment for the BV. Patient reporting that she is prone to  developing vulvovaginal candidiasis associated with antimicrobial therapy. Will send in prescription for prophylactic oral fluconazole for PRN use.    Discussed follow up with primary care physician in 1 week for re-evaluation. I have reviewed the follow up and strict return precautions for any new or worsening symptoms. Patient is aware of symptoms that would be deemed urgent/emergent, and would thus require further evaluation either here or in the emergency department. At the time of discharge, she verbalized understanding and consent with the discharge plan as it was reviewed with her. All questions were fielded by provider and/or clinic staff prior to  patient discharge.    Final Clinical Impressions / Urgent Care Diagnoses:   Final diagnoses:  BV (bacterial vaginosis)  Vaginal discharge  Exposure to sexually transmitted disease (STD)    New Prescriptions:  [place] Controlled Substance Registry consulted? Not Applicable  Meds ordered this encounter  Medications   cefTRIAXone (ROCEPHIN) injection 250 mg   azithromycin (ZITHROMAX) tablet 1,000 mg   tinidazole (TINDAMAX) 500 MG tablet    Sig: Take 4 tablets (2,000 mg total) by mouth daily for 2 days.    Dispense:  8 tablet    Refill:  0   fluconazole (DIFLUCAN) 150 MG tablet    Sig: Take 1 tablet (150 mg) PO x 1 dose. May repeat 150 mg dose in 3 days if still symptomatic.    Dispense:  2 tablet    Refill:  0    Recommended Follow up Care:  Patient encouraged to follow up with the following provider within the specified time frame, or sooner as dictated by the severity of her symptoms. As always, she was instructed that for any urgent/emergent care needs, she should seek care either here or in the emergency department for more immediate evaluation.  Follow-up Information    Zoila Shutter, MD In 1 week.   Specialty: Family Medicine        NOTE: This note was prepared using Lobbyist along with smaller Company secretary. Despite my best ability to proofread, there is the potential that transcriptional errors may still occur from this process, and are completely unintentional.    Karen Kitchens, NP 09/04/19 1800

## 2019-09-03 NOTE — ED Triage Notes (Signed)
Pt c/o vaginal odor, yellow/green vaginal discharge. Started about a week ago. She has h/o BV but says this is different.

## 2019-09-05 LAB — GC/CHLAMYDIA PROBE AMP
Chlamydia trachomatis, NAA: NEGATIVE
Neisseria Gonorrhoeae by PCR: NEGATIVE

## 2019-11-12 ENCOUNTER — Other Ambulatory Visit: Payer: Self-pay | Admitting: Family Medicine

## 2020-01-07 ENCOUNTER — Encounter: Payer: Self-pay | Admitting: Emergency Medicine

## 2020-01-07 ENCOUNTER — Other Ambulatory Visit: Payer: Self-pay

## 2020-01-07 ENCOUNTER — Ambulatory Visit
Admission: EM | Admit: 2020-01-07 | Discharge: 2020-01-07 | Disposition: A | Discharge status: Home / Self Care | Payer: Managed Care, Other (non HMO) | Attending: Family Medicine | Admitting: Family Medicine

## 2020-01-07 DIAGNOSIS — B9689 Other specified bacterial agents as the cause of diseases classified elsewhere: Secondary | ICD-10-CM | POA: Diagnosis present

## 2020-01-07 DIAGNOSIS — N760 Acute vaginitis: Secondary | ICD-10-CM | POA: Diagnosis present

## 2020-01-07 DIAGNOSIS — B373 Candidiasis of vulva and vagina: Secondary | ICD-10-CM | POA: Insufficient documentation

## 2020-01-07 DIAGNOSIS — B3731 Acute candidiasis of vulva and vagina: Secondary | ICD-10-CM

## 2020-01-07 LAB — URINALYSIS, COMPLETE (UACMP) WITH MICROSCOPIC
Bilirubin Urine: NEGATIVE
Glucose, UA: NEGATIVE mg/dL
Ketones, ur: NEGATIVE mg/dL
Nitrite: NEGATIVE
Protein, ur: NEGATIVE mg/dL
Specific Gravity, Urine: 1.02 (ref 1.005–1.030)
pH: 6.5 (ref 5.0–8.0)

## 2020-01-07 LAB — WET PREP, GENITAL
Sperm: NONE SEEN
Trich, Wet Prep: NONE SEEN

## 2020-01-07 MED ORDER — FLUCONAZOLE 150 MG PO TABS: 150.0000 mg | ORAL_TABLET | Freq: Once | ORAL | 0 refills | Status: AC

## 2020-01-07 MED ORDER — METRONIDAZOLE 500 MG PO TABS: 500.0000 mg | ORAL_TABLET | Freq: Two times a day (BID) | ORAL | 0 refills | Status: AC

## 2020-01-07 NOTE — ED Provider Notes (Signed)
[facility]    CSN: [id number] Arrival date & time: 01/07/20  1732      History   Chief Complaint Chief Complaint  Patient presents with  . Vaginal Discharge  . Dysuria   HPI  39 year old female presents with vaginal discharge. She has had some mild dysuria as well.  1 week history of vaginal discharge. Described as white. Denies odor. She states that she has had some mild intermittent burning with urination. She believes that she has a yeast infection. She has recently changed her body wash/soap. She thinks that this is the inciting factor. No concerns for STDs. No medications or interventions tried. No other associated symptoms. No other complaints.  Past Medical History:  Diagnosis Date  . History of hyperthyroidism    Past Surgical History:  Procedure Laterality Date  . LAPAROSCOPIC GASTRIC SLEEVE RESECTION    . MASS EXCISION Left 05/31/2018   Procedure: EXCISION MASS  HAND;  Surgeon: Leanor Kail, MD;  Location: [facility];  Service: Orthopedics;  Laterality: Left;    OB History   No obstetric history on file.      Home Medications    Prior to Admission medications   Medication Sig Start Date End Date Taking? Authorizing Provider  levonorgestrel (MIRENA) 20 MCG/24HR IUD by Intrauterine route.   Yes [provider]  fluconazole (DIFLUCAN) 150 MG tablet Take 1 tablet (150 mg total) by mouth once for 1 dose. Repeat dose in 72 hours. 01/07/20 01/07/20  Coral Spikes, DO  metroNIDAZOLE (FLAGYL) 500 MG tablet Take 1 tablet (500 mg total) by mouth 2 (two) times daily. 01/07/20   Coral Spikes, DO    Family History Family History  Problem Relation Age of Onset  . Hypertension Mother     Social History Social History   Tobacco Use  . Smoking status: Never Smoker  . Smokeless tobacco: Never Used  Substance Use Topics  . Alcohol use: Never  . Drug use: Never     Allergies   Patient has no known allergies.   Review of  Systems Review of Systems  Constitutional: Negative.   Genitourinary: Positive for dysuria and vaginal discharge.   Physical Exam Triage Vital Signs ED Triage Vitals  Enc Vitals Group     BP 01/07/20 1801 104/68     Pulse Rate 01/07/20 1801 (!) 58     Resp 01/07/20 1801 18     Temp 01/07/20 1801 98.2 F (36.8 C)     Temp Source 01/07/20 1801 Oral     SpO2 01/07/20 1801 100 %     Weight 01/07/20 1759 210 lb (95.3 kg)     Height 01/07/20 1759 5\' 3"  (1.6 m)     Head Circumference --      Peak Flow --      Pain Score 01/07/20 1759 0     Pain Loc --      Pain Edu? --      Excl. in [place]? --    Updated Vital Signs BP 104/68 (BP Location: Left Arm)   Pulse (!) 58   Temp 98.2 F (36.8 C) (Oral)   Resp 18   Ht 5\' 3"  (1.6 m)   Wt 95.3 kg   SpO2 100%   BMI 37.20 kg/m   Visual Acuity Right Eye Distance:   Left Eye Distance:   Bilateral Distance:    Right Eye Near:   Left Eye Near:    Bilateral Near:     Physical  Exam Vitals and nursing note reviewed.  Constitutional:      General: She is not in acute distress.    Appearance: Normal appearance. She is not ill-appearing.  HENT:     Head: Normocephalic and atraumatic.  Eyes:     General:        Right eye: No discharge.        Left eye: No discharge.     Conjunctiva/sclera: Conjunctivae normal.  Cardiovascular:     Rate and Rhythm: Normal rate and regular rhythm.     Heart sounds: No murmur.  Pulmonary:     Effort: Pulmonary effort is normal.     Breath sounds: Normal breath sounds. No wheezing, rhonchi or rales.  Neurological:     Mental Status: She is alert.  Psychiatric:        Mood and Affect: Mood normal.        Behavior: Behavior normal.    UC Treatments / Results  Labs (all labs ordered are listed, but only abnormal results are displayed) Labs Reviewed  WET PREP, GENITAL - Abnormal; Notable for the following components:      Result Value   Yeast Wet Prep HPF POC PRESENT (*)    Clue Cells Wet Prep HPF  POC PRESENT (*)    WBC, Wet Prep HPF POC FEW (*)    All other components within normal limits  URINALYSIS, COMPLETE (UACMP) WITH MICROSCOPIC - Abnormal; Notable for the following components:   Hgb urine dipstick TRACE (*)    Leukocytes,Ua MODERATE (*)    Bacteria, UA FEW (*)    All other components within normal limits    EKG   Radiology No results found.  Procedures Procedures (including critical care time)  Medications Ordered in UC Medications - No data to display  Initial Impression / Assessment and Plan / UC Course  I have reviewed the triage vital signs and the nursing notes.  Pertinent labs & imaging results that were available during my care of the patient were reviewed by me and considered in my medical decision making (see chart for details).    39 year old female presents with bacterial vaginosis and yeast vaginitis.  Flagyl and Diflucan as directed.  Final Clinical Impressions(s) / UC Diagnoses   Final diagnoses:  Bacterial vaginosis  Yeast vaginitis   Discharge Instructions   None    ED Prescriptions    Medication Sig Dispense Auth. Provider   metroNIDAZOLE (FLAGYL) 500 MG tablet Take 1 tablet (500 mg total) by mouth 2 (two) times daily. 14 tablet Raydel Hosick G, DO   fluconazole (DIFLUCAN) 150 MG tablet Take 1 tablet (150 mg total) by mouth once for 1 dose. Repeat dose in 72 hours. 2 tablet Coral Spikes, DO     PDMP not reviewed this encounter.   Coral Spikes, [place] 01/07/20 1929

## 2020-01-07 NOTE — ED Triage Notes (Signed)
Patient c/o white vaginal discharge that started 1 week ago. She denies itching but does report burning with urination.

## 2020-06-01 ENCOUNTER — Other Ambulatory Visit: Payer: Self-pay

## 2020-06-01 ENCOUNTER — Encounter: Payer: Self-pay | Admitting: Emergency Medicine

## 2020-06-01 ENCOUNTER — Ambulatory Visit
Admission: EM | Admit: 2020-06-01 | Discharge: 2020-06-01 | Disposition: A | Discharge status: Home / Self Care | Payer: Managed Care, Other (non HMO) | Attending: Physician Assistant | Admitting: Physician Assistant

## 2020-06-01 DIAGNOSIS — M25561 Pain in right knee: Secondary | ICD-10-CM

## 2020-06-01 MED ORDER — HYDROCODONE-ACETAMINOPHEN 5-325 MG PO TABS: 1.0000 | ORAL_TABLET | Freq: Four times a day (QID) | ORAL | 0 refills | Status: AC | PRN

## 2020-06-01 MED ORDER — TIZANIDINE HCL 4 MG PO CAPS: 4.0000 mg | ORAL_CAPSULE | Freq: Three times a day (TID) | ORAL | 0 refills | Status: DC | PRN

## 2020-06-01 MED ORDER — METHOCARBAMOL 500 MG PO TABS: 500.0000 mg | ORAL_TABLET | Freq: Four times a day (QID) | ORAL | Status: DC | PRN
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
## 2020-06-01 NOTE — Discharge Instructions (Signed)
-  Can stop taking the Robaxin -Recommend continuing the cold and heat therapy as recommended previously. -Recommend continue the range of motion exercises provided by urgent care on Sunday -Work modifications as needed based on your pain and symptoms. -Continue the Tylenol for pain -Norco: 1 tablet every 6 hours as needed for pain not relieved by Tylenol.  Caution in using with Tylenol as do not exceed 4 grams a day total.

## 2020-09-28 ENCOUNTER — Other Ambulatory Visit: Payer: Self-pay | Admitting: *Deleted

## 2020-09-28 ENCOUNTER — Inpatient Hospital Stay
Admission: RE | Admit: 2020-09-28 | Discharge: 2020-09-28 | Disposition: A | Discharge status: Home / Self Care | Payer: Self-pay | Source: Ambulatory Visit | Attending: *Deleted | Admitting: *Deleted

## 2020-09-28 DIAGNOSIS — Z1231 Encounter for screening mammogram for malignant neoplasm of breast: Secondary | ICD-10-CM

## 2020-10-14 ENCOUNTER — Other Ambulatory Visit: Payer: Self-pay | Admitting: Family Medicine

## 2020-10-14 DIAGNOSIS — N6310 Unspecified lump in the right breast, unspecified quadrant: Secondary | ICD-10-CM

## 2020-10-27 ENCOUNTER — Other Ambulatory Visit: Payer: Self-pay

## 2020-10-27 ENCOUNTER — Ambulatory Visit
Admission: RE | Admit: 2020-10-27 | Discharge: 2020-10-27 | Disposition: A | Discharge status: Home / Self Care | Payer: Managed Care, Other (non HMO) | Source: Ambulatory Visit | Attending: Family Medicine | Admitting: Family Medicine

## 2020-10-27 DIAGNOSIS — N6310 Unspecified lump in the right breast, unspecified quadrant: Secondary | ICD-10-CM | POA: Diagnosis present

## 2020-10-27 DIAGNOSIS — N6311 Unspecified lump in the right breast, upper outer quadrant: Secondary | ICD-10-CM | POA: Diagnosis not present

## 2021-03-11 IMAGING — MG DIGITAL DIAGNOSTIC BILAT W/ TOMO W/ CAD
6 of 10 series · 6 of 30 positions shown · non-contrast
Comparison: Previous exam(s).

CLINICAL DATA: RIGHT breast palpable area on physical exam.

EXAM:
DIGITAL DIAGNOSTIC BILATERAL MAMMOGRAM WITH TOMO AND CAD; ULTRASOUND
RIGHT BREAST LIMITED

[L CC synth-2D]
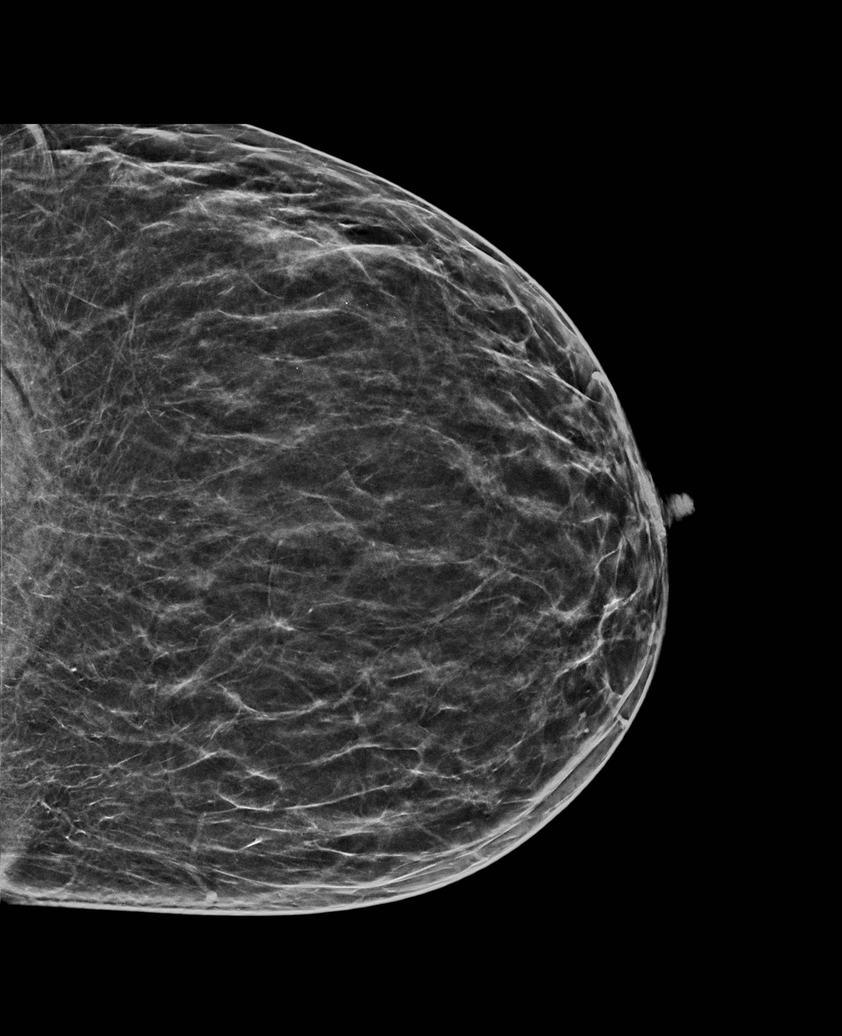

[L MLO synth-2D]
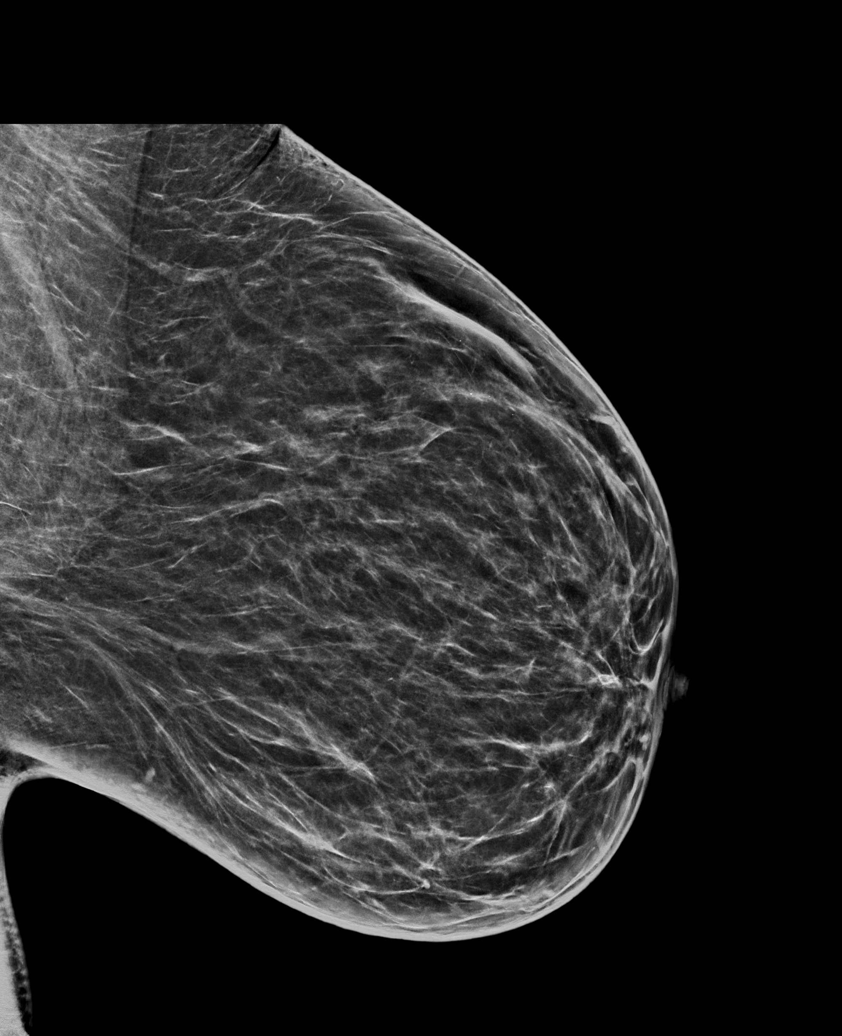

[R MLO synth-2D]
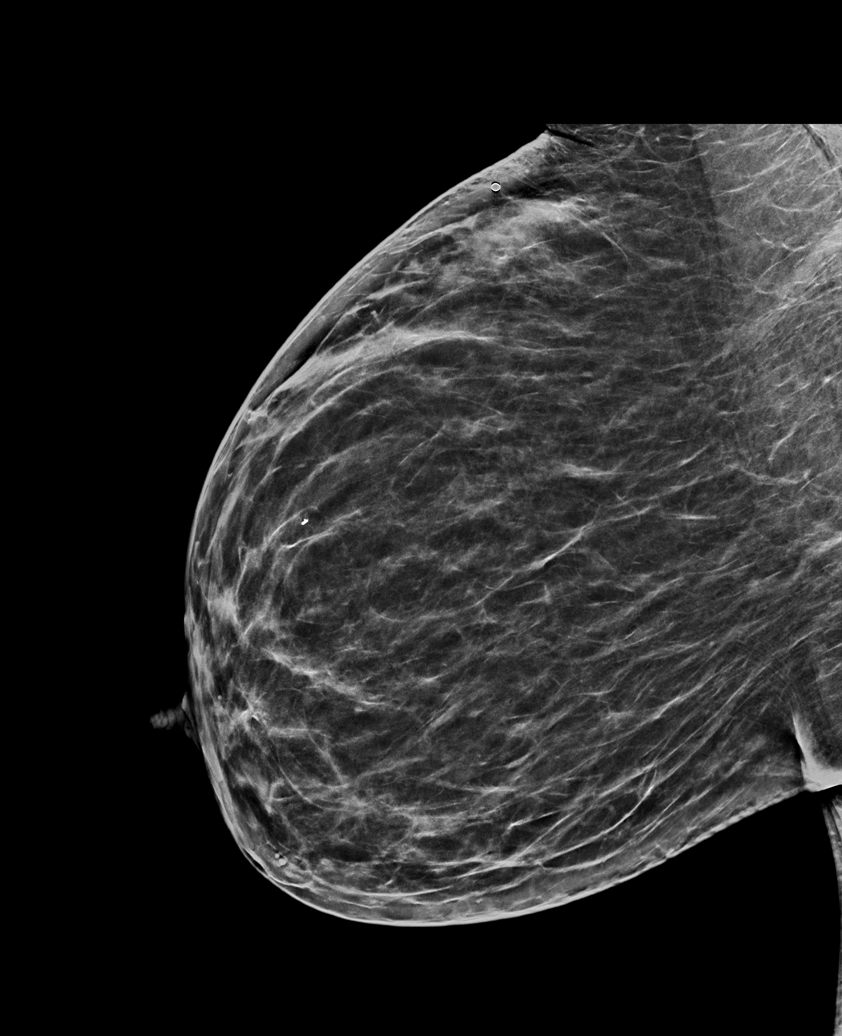

[R TAN synth-2D]
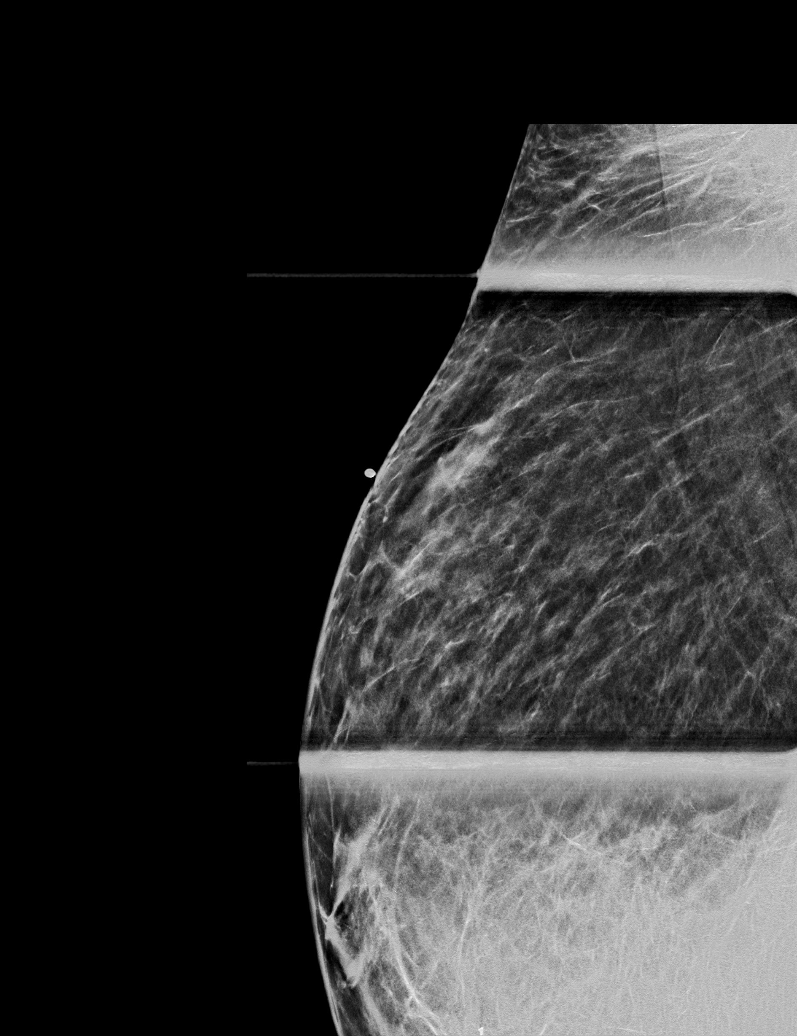

[R CC synth-2D]
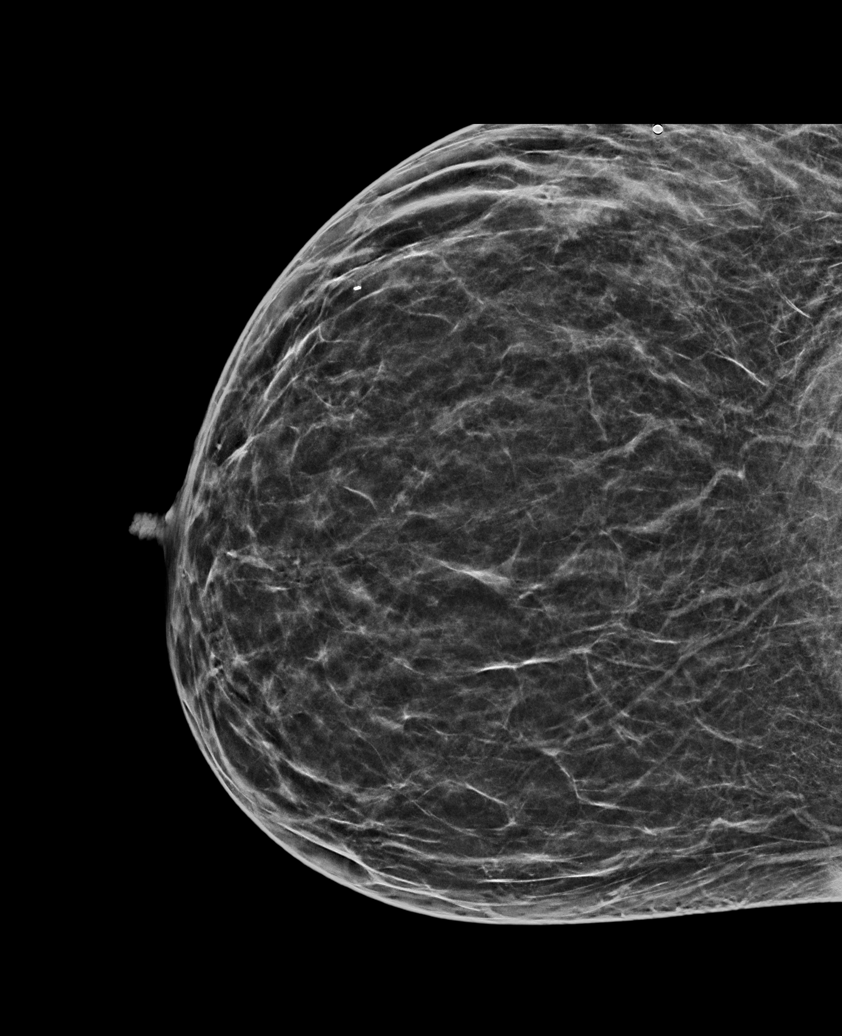

[L MLO tomo · tomo slice 35/69.0]
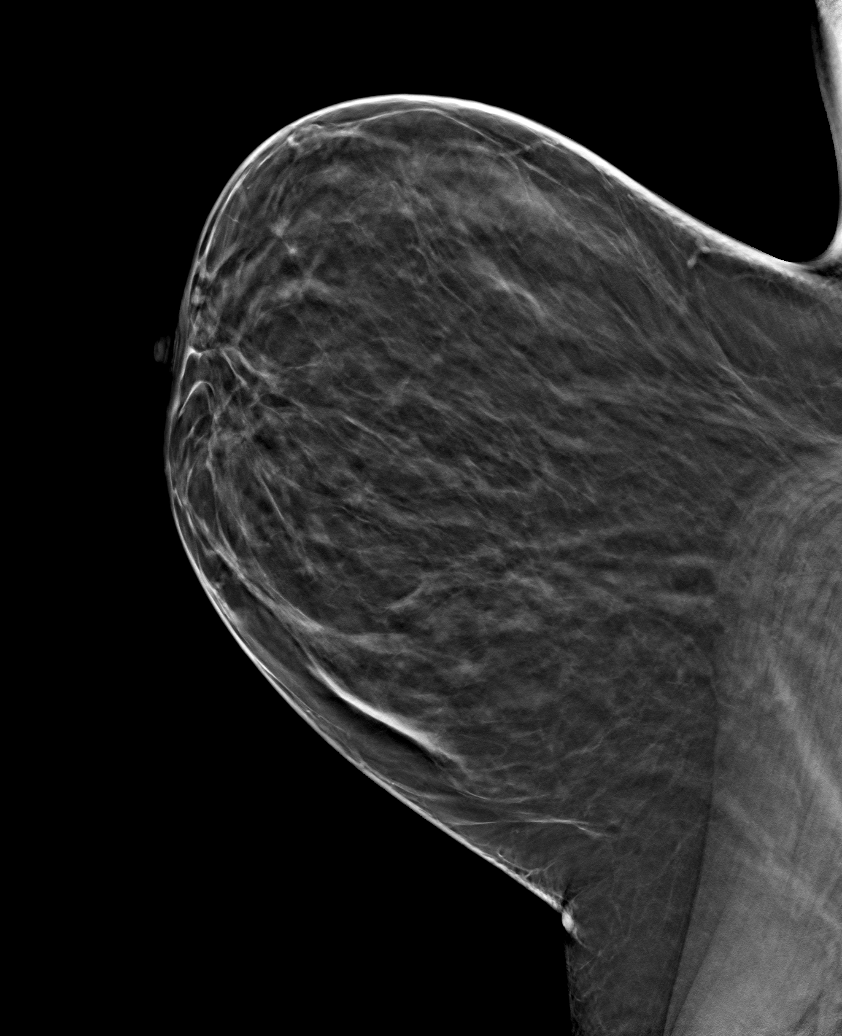

[6 of 30 positions shown; findings below may reference images not displayed]

ACR Breast Density Category b: There are scattered areas of
fibroglandular density.
FINDINGS: Spot compression was obtained over the palpable area of concern in
the RIGHT breast. No suspicious mammographic finding is identified
in this area. No suspicious mass, microcalcification, or other
finding is identified in either breast.

Mammographic images were processed with CAD.

On physical exam, there is a firm ridge of tissue in the RIGHT upper
outer breast

Targeted RIGHT breast ultrasound was performed in the palpable area
of concern at the RIGHT upper outer breast. No suspicious solid or
cystic mass is identified. There is fibroglandular tissue at the
site of palpable concern
IMPRESSION: 1. No mammographic or sonographic evidence of malignancy at the site
of palpable concern in the RIGHT breast. Any further workup of the
patient's symptoms should be based on the clinical assessment.
Recommend routine annual screening mammogram in 1 year.
2. No mammographic evidence of malignancy bilaterally.

RECOMMENDATION:
Screening mammogram in one year.(Code:[id number]-[id number])

I have discussed the findings and recommendations with the patient.
If applicable, a reminder letter will be sent to the patient
regarding the next appointment.

BI-RADS CATEGORY  1: Negative.

## 2021-12-04 HISTORY — PX: BREAST BIOPSY: SHX20

## 2021-12-20 ENCOUNTER — Other Ambulatory Visit: Payer: Self-pay | Admitting: Family Medicine

## 2021-12-20 DIAGNOSIS — Z1231 Encounter for screening mammogram for malignant neoplasm of breast: Secondary | ICD-10-CM

## 2022-01-11 ENCOUNTER — Ambulatory Visit
Admission: RE | Admit: 2022-01-11 | Discharge: 2022-01-11 | Disposition: A | Discharge status: Home / Self Care | Payer: Managed Care, Other (non HMO) | Source: Ambulatory Visit | Attending: Family Medicine | Admitting: Family Medicine

## 2022-01-11 ENCOUNTER — Other Ambulatory Visit: Payer: Self-pay

## 2022-01-11 DIAGNOSIS — Z1231 Encounter for screening mammogram for malignant neoplasm of breast: Secondary | ICD-10-CM | POA: Diagnosis present

## 2022-01-12 ENCOUNTER — Other Ambulatory Visit: Payer: Self-pay | Admitting: Family Medicine

## 2022-01-18 ENCOUNTER — Other Ambulatory Visit: Payer: Self-pay | Admitting: Family Medicine

## 2022-01-18 DIAGNOSIS — R928 Other abnormal and inconclusive findings on diagnostic imaging of breast: Secondary | ICD-10-CM

## 2022-01-18 DIAGNOSIS — N63 Unspecified lump in unspecified breast: Secondary | ICD-10-CM

## 2022-02-07 ENCOUNTER — Other Ambulatory Visit: Payer: Self-pay

## 2022-02-07 ENCOUNTER — Ambulatory Visit
Admission: RE | Admit: 2022-02-07 | Discharge: 2022-02-07 | Disposition: A | Discharge status: Home / Self Care | Payer: Managed Care, Other (non HMO) | Source: Ambulatory Visit | Attending: Family Medicine | Admitting: Family Medicine

## 2022-02-07 DIAGNOSIS — R928 Other abnormal and inconclusive findings on diagnostic imaging of breast: Secondary | ICD-10-CM | POA: Diagnosis not present

## 2022-02-07 DIAGNOSIS — N63 Unspecified lump in unspecified breast: Secondary | ICD-10-CM | POA: Insufficient documentation

## 2022-02-09 ENCOUNTER — Other Ambulatory Visit: Payer: Self-pay | Admitting: Family Medicine

## 2022-02-22 ENCOUNTER — Other Ambulatory Visit: Payer: Self-pay | Admitting: Family Medicine

## 2022-02-22 DIAGNOSIS — R928 Other abnormal and inconclusive findings on diagnostic imaging of breast: Secondary | ICD-10-CM

## 2022-02-22 DIAGNOSIS — N63 Unspecified lump in unspecified breast: Secondary | ICD-10-CM

## 2022-02-22 DIAGNOSIS — R599 Enlarged lymph nodes, unspecified: Secondary | ICD-10-CM

## 2022-03-02 ENCOUNTER — Ambulatory Visit
Admission: RE | Admit: 2022-03-02 | Discharge: 2022-03-02 | Disposition: A | Discharge status: Home / Self Care | Payer: Managed Care, Other (non HMO) | Source: Ambulatory Visit | Attending: Family Medicine | Admitting: Family Medicine

## 2022-03-02 DIAGNOSIS — R599 Enlarged lymph nodes, unspecified: Secondary | ICD-10-CM

## 2022-03-02 DIAGNOSIS — R928 Other abnormal and inconclusive findings on diagnostic imaging of breast: Secondary | ICD-10-CM

## 2022-03-02 DIAGNOSIS — N63 Unspecified lump in unspecified breast: Secondary | ICD-10-CM | POA: Insufficient documentation

## 2022-03-03 LAB — SURGICAL PATHOLOGY

## 2022-05-10 ENCOUNTER — Other Ambulatory Visit: Payer: Self-pay | Admitting: Family Medicine

## 2022-05-10 DIAGNOSIS — R599 Enlarged lymph nodes, unspecified: Secondary | ICD-10-CM

## 2022-06-05 ENCOUNTER — Ambulatory Visit
Admission: RE | Admit: 2022-06-05 | Discharge: 2022-06-05 | Disposition: A | Discharge status: Home / Self Care | Payer: Managed Care, Other (non HMO) | Source: Ambulatory Visit | Attending: Family Medicine | Admitting: Family Medicine

## 2022-06-05 DIAGNOSIS — R599 Enlarged lymph nodes, unspecified: Secondary | ICD-10-CM | POA: Diagnosis present

## 2022-12-04 ENCOUNTER — Ambulatory Visit
Admission: EM | Admit: 2022-12-04 | Discharge: 2022-12-04 | Disposition: A | Discharge status: Home / Self Care | Payer: Managed Care, Other (non HMO)

## 2022-12-04 ENCOUNTER — Ambulatory Visit (INDEPENDENT_AMBULATORY_CARE_PROVIDER_SITE_OTHER): Payer: Managed Care, Other (non HMO)

## 2022-12-04 ENCOUNTER — Encounter: Payer: Self-pay | Admitting: Emergency Medicine

## 2022-12-04 DIAGNOSIS — M25512 Pain in left shoulder: Secondary | ICD-10-CM | POA: Diagnosis not present

## 2022-12-04 MED ORDER — HYDROCODONE-ACETAMINOPHEN 5-325 MG PO TABS: 1.0000 | ORAL_TABLET | Freq: Four times a day (QID) | ORAL | 0 refills | Status: AC | PRN

## 2022-12-04 NOTE — ED Triage Notes (Signed)
Pt presents with left shoulder pain x 2 months. Patient denies any injury.

## 2022-12-04 NOTE — Discharge Instructions (Addendum)
-  Your x-ray shows a downsloping acromion which can cause shoulder impingement where a nerve or tendons get pinched.  At this time would like you to follow-up with Ortho.  If that something for severe pain if needed.  You can also use topical Voltaren gel, heat, ice and make sure to stretch.  You have a condition requiring you to follow up with Orthopedics so please call one of the following office for appointment:   [facility] [address] [place], [place] [address] Phone: [phone]  [facility] [address], [place], [place] [address] Phone: [phone]

## 2022-12-04 NOTE — ED Provider Notes (Signed)
[facility]    CSN: [id number] Arrival date & time: 12/04/22  1220      History   Chief Complaint Chief Complaint  Patient presents with   Shoulder Pain    Left shoulder pain for about 2 months getting worse - Entered by patient    HPI Alexa Baker is a 42 y.o. female presenting for 47-monthhistory of progressively worsening atraumatic left-sided shoulder pain.  Patient reports that she cannot really reach behind her back without significant pain.  Denies any radiation of the pain.  No numbness, weakness or tingling.  Patient reports that she was going to physical therapy for thoracic back pain which is related to breast condition.  Reports that she was told she had some weakness on the left side.  This was a couple months ago.  She has been taking Tylenol Extra Strength for the pain but says it has not really helped.  No history of major injury or surgery of this extremity in the past.  She is right-handed.  No other concerns or complaints.  HPI  Past Medical History:  Diagnosis Date   History of hyperthyroidism     There are no problems to display for this patient.   Past Surgical History:  Procedure Laterality Date   LAPAROSCOPIC GASTRIC SLEEVE RESECTION     MASS EXCISION Left 05/31/2018   Procedure: EXCISION MASS  HAND;  Surgeon: KLeanor Kail MD;  [facility]  Service: Orthopedics;  Laterality: Left;    OB History   No obstetric history on file.      Home Medications    Prior to Admission medications   Medication Sig Start Date End Date Taking? Authorizing Provider  buPROPion (WELLBUTRIN XL) 150 MG 24 hr tablet Take 150 mg by mouth daily. 10/03/22  Yes [provider]  HYDROcodone-acetaminophen (NORCO/VICODIN) 5-325 MG tablet Take 1 tablet by mouth every 6 (six) hours as needed for up to 3 days. 12/04/22 12/07/22 Yes EDanton Clap PA-C  levonorgestrel (MIRENA) 20 MCG/24HR IUD by Intrauterine route.   Yes [provider]  metroNIDAZOLE (FLAGYL) 500 MG tablet Take 1 tablet (500 mg total) by mouth 2 (two) times daily. 01/07/20   CCoral Spikes DO  tiZANidine (ZANAFLEX) 4 MG capsule Take 1 capsule (4 mg total) by mouth 3 (three) times daily as needed for muscle spasms. 06/01/20   HLuvenia Redden PA-C    Family History Family History  Problem Relation Age of Onset   Hypertension Mother    Breast cancer Neg Hx     Social History Social History   Tobacco Use   Smoking status: Never   Smokeless tobacco: Never  Vaping Use   Vaping Use: Never used  Substance Use Topics   Alcohol use: Never   Drug use: Never     Allergies   Nsaids   Review of Systems Review of Systems  Musculoskeletal:  Positive for arthralgias and back pain (chronic). Negative for neck pain and neck stiffness.  Neurological:  Negative for weakness and numbness.     Physical Exam Triage Vital Signs ED Triage Vitals [12/04/22 1409]  Enc Vitals Group     BP      Pulse      Resp      Temp      Temp src      SpO2      Weight      Height      Head Circumference  Peak Flow      Pain Score 6     Pain Loc      Pain Edu?      Excl. in [place]?    No data found.  Updated Vital Signs BP 104/75 (BP Location: Right Arm)   Pulse (!) 59   Temp 97.8 F (36.6 C) (Oral)   Resp 16   SpO2 98%     Physical Exam Vitals and nursing note reviewed.  Constitutional:      General: She is not in acute distress.    Appearance: Normal appearance. She is not ill-appearing or toxic-appearing.  HENT:     Head: Normocephalic and atraumatic.  Eyes:     General: No scleral icterus.       Right eye: No discharge.        Left eye: No discharge.     Conjunctiva/sclera: Conjunctivae normal.  Cardiovascular:     Rate and Rhythm: Regular rhythm. Bradycardia present.     Heart sounds: Normal heart sounds.  Pulmonary:     Effort: Pulmonary effort is normal. No respiratory distress.     Breath sounds: Normal breath sounds.   Musculoskeletal:     Left shoulder: Swelling and tenderness (AC joint, lateral deltoind) present. No deformity. Decreased range of motion (unable to reach behind back). Decreased strength (4/5 on left and 5/5 right). Normal pulse.     Cervical back: Neck supple.  Skin:    General: Skin is dry.  Neurological:     General: No focal deficit present.     Mental Status: She is alert. Mental status is at baseline.     Motor: No weakness.     Gait: Gait normal.  Psychiatric:        Mood and Affect: Mood normal.        Behavior: Behavior normal.        Thought Content: Thought content normal.      UC Treatments / Results  Labs (all labs ordered are listed, but only abnormal results are displayed) Labs Reviewed - No data to display  EKG   Radiology DG Shoulder Left  Result Date: 12/04/2022 CLINICAL DATA:  Left shoulder pain for 2 months.  No known trauma. EXAM: LEFT SHOULDER - 2+ VIEW COMPARISON:  None Available. FINDINGS: Normal glenohumeral and acromioclavicular alignment. Mild lateral downsloping of the acromion. No acute fracture or dislocation. The visualized portion of the left lung is unremarkable. IMPRESSION: Mild lateral downsloping of the acromion. Electronically Signed   By: Yvonne Kendall M.D.   On: 12/04/2022 14:41    Procedures Procedures (including critical care time)  Medications Ordered in UC Medications - No data to display  Initial Impression / Assessment and Plan / UC Course  I have reviewed the triage vital signs and the nursing notes.  Pertinent labs & imaging results that were available during my care of the patient were reviewed by me and considered in my medical decision making (see chart for details).   41 year old female presents for 15-monthhistory of left-sided atraumatic shoulder pain which is progressively worsening.  X-ray performed today shows downsloping acromion.  No other acute findings noted.  On exam she has normal abduction and flexion as well  as abduction the patient has 2 use her other arm to fully lift her shoulder.  She is unable to reach behind her back.  4-5 strength of left shoulder compared to 5 out of 5 on right.  Possible shoulder impingement.  Will treat at this  time with Norco and have her follow-up with [facility] or [facility] orthopedics for further evaluation of her shoulder and likely subsequent imaging.  Also advised supportive care advice guidelines.   Final Clinical Impressions(s) / UC Diagnoses   Final diagnoses:  Acute pain of left shoulder     Discharge Instructions      -Your x-ray shows a downsloping acromion which can cause shoulder impingement where a nerve or tendons get pinched.  At this time would like you to follow-up with Ortho.  If that something for severe pain if needed.  You can also use topical Voltaren gel, heat, ice and make sure to stretch.  You have a condition requiring you to follow up with Orthopedics so please call one of the following office for appointment:   [facility] [address] [place], [place] [address] Phone: [phone]  [facility] [address], [place], [place] [address] Phone: [phone]      ED Prescriptions     Medication Sig Dispense Auth. Provider   HYDROcodone-acetaminophen (NORCO/VICODIN) 5-325 MG tablet Take 1 tablet by mouth every 6 (six) hours as needed for up to 3 days. 12 tablet Danton Clap, PA-C      I have reviewed the PDMP during this encounter.   Danton Clap, PA-C 12/04/22 [phone]

## 2022-12-09 ENCOUNTER — Telehealth: Payer: Self-pay | Admitting: Physician Assistant

## 2022-12-09 MED ORDER — HYDROCODONE-ACETAMINOPHEN 5-325 MG PO TABS: 1.0000 | ORAL_TABLET | Freq: Four times a day (QID) | ORAL | 0 refills | Status: AC | PRN

## 2022-12-09 NOTE — Telephone Encounter (Signed)
Hydrocodone backorder at [facility] in [place].  Call to cancel prescription and sent it to [facility] in [place].

## 2022-12-29 ENCOUNTER — Other Ambulatory Visit: Payer: Self-pay | Admitting: Family Medicine

## 2022-12-29 DIAGNOSIS — Z1231 Encounter for screening mammogram for malignant neoplasm of breast: Secondary | ICD-10-CM

## 2023-01-16 ENCOUNTER — Ambulatory Visit: Payer: Managed Care, Other (non HMO)

## 2023-01-29 ENCOUNTER — Ambulatory Visit: Admission: RE | Admit: 2023-01-29 | Payer: Managed Care, Other (non HMO) | Source: Ambulatory Visit

## 2023-02-14 ENCOUNTER — Ambulatory Visit
Admission: RE | Admit: 2023-02-14 | Discharge: 2023-02-14 | Disposition: A | Discharge status: Home / Self Care | Payer: Managed Care, Other (non HMO) | Source: Ambulatory Visit | Attending: Family Medicine | Admitting: Family Medicine

## 2023-02-14 DIAGNOSIS — Z1231 Encounter for screening mammogram for malignant neoplasm of breast: Secondary | ICD-10-CM | POA: Diagnosis present

## 2023-06-04 HISTORY — PX: REDUCTION MAMMAPLASTY: SUR839

## 2023-10-18 ENCOUNTER — Ambulatory Visit
Admission: EM | Admit: 2023-10-18 | Discharge: 2023-10-18 | Disposition: A | Discharge status: Home / Self Care | Payer: Medicaid Other

## 2023-10-18 DIAGNOSIS — B001 Herpesviral vesicular dermatitis: Secondary | ICD-10-CM

## 2023-10-18 MED ORDER — ACYCLOVIR 800 MG PO TABS: 800.0000 mg | ORAL_TABLET | Freq: Every day | ORAL | 1 refills | Status: AC

## 2023-10-18 MED ORDER — ACYCLOVIR 800 MG PO TABS: 800.0000 mg | ORAL_TABLET | Freq: Every day | ORAL | 1 refills | Status: DC

## 2023-10-18 NOTE — ED Triage Notes (Signed)
Pt c/o cold sore in lip x1 day.

## 2023-10-18 NOTE — Discharge Instructions (Addendum)
Take antiviral as directed. Drink plenty of water. Return as needed.

## 2023-10-18 NOTE — ED Provider Notes (Signed)
[facility]    CSN: [id number] Arrival date & time: 10/18/23  1748      History   Chief Complaint Chief Complaint  Patient presents with   Mouth Lesions    HPI Alexa Baker is a 41 y.o. female.   42 year old female, Alexa Baker, presents to urgent care for evaluation of cold sore to left upper lip x 1 day.  Patient has a history of the same and is out of her acyclovir, requesting a refill.  The history is provided by the patient. No language interpreter was used.    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      Home Medications    Prior to Admission medications   Medication Sig Start Date End Date Taking? Authorizing Provider  buPROPion (WELLBUTRIN XL) 150 MG 24 hr tablet Take 150 mg by mouth daily. 10/03/22  Yes [provider]  levonorgestrel (MIRENA) 20 MCG/24HR IUD by Intrauterine route.   Yes [provider]  metFORMIN (GLUCOPHAGE-XR) 500 MG 24 hr tablet Take by mouth. 09/03/23  Yes [provider]  phentermine (ADIPEX-P) 37.5 MG tablet Take by mouth. 09/25/23  Yes [provider]  acyclovir (ZOVIRAX) 800 MG tablet Take 1 tablet (800 mg total) by mouth 5 (five) times daily for 10 days. 10/18/23 10/28/23  Dortha Neighbors, Para March, NP  metroNIDAZOLE (FLAGYL) 500 MG tablet Take 1 tablet (500 mg total) by mouth 2 (two) times daily. 01/07/20   Tommie Sams, DO    Family History Family History  Problem Relation Age of Onset   Hypertension Mother    Breast cancer Neg Hx     Social  History Social History   Tobacco Use   Smoking status: Never   Smokeless tobacco: Never  Vaping Use   Vaping status: Never Used  Substance Use Topics   Alcohol use: Never   Drug use: Never     Allergies   Nsaids   Review of Systems Review of Systems  Constitutional:  Negative for fever.  HENT:  Positive for mouth sores.   All other systems reviewed and are negative.    Physical Exam Triage Vital Signs ED Triage Vitals  Encounter Vitals Group     BP 10/18/23 1752 118/83     Systolic BP Percentile --      Diastolic BP Percentile --      Pulse Rate 10/18/23 1752 63     Resp 10/18/23 1752 16     Temp 10/18/23 1752 98.7 F (37.1 C)     Temp Source 10/18/23 1752 Oral     SpO2 10/18/23 1752 98 %     Weight 10/18/23 1751 197 lb (89.4 kg)     Height 10/18/23 1751 5\' 3"  (1.6 m)     Head Circumference --      Peak Flow --      Pain Score 10/18/23 1757 6     Pain Loc --      Pain Education --      Exclude from [employer]  Chart --    No data found.  Updated Vital Signs BP 118/83 (BP Location: Left Arm)   Pulse 63   Temp 98.7 F (37.1 C) (Oral)   Resp 16   Ht 5\' 3"  (1.6 m)   Wt 197 lb (89.4 kg)   SpO2 98%   BMI 34.90 kg/m   Visual Acuity Right Eye Distance:   Left Eye Distance:   Bilateral Distance:    Right Eye Near:   Left Eye Near:    Bilateral Near:     Physical Exam Vitals and nursing note reviewed.  Constitutional:      Appearance: She is well-developed and well-groomed.  HENT:     Head: Normocephalic.     Right Ear: Tympanic membrane normal.     Left Ear: Tympanic membrane normal.     Mouth/Throat:     Lips: Pink. Lesions present.     Mouth: No oral lesions.     Comments: Left upper lip with cluster of vesicles noted Cardiovascular:     Rate and Rhythm: Normal rate.  Pulmonary:     Effort: Pulmonary effort is normal.  Psychiatric:        Behavior: Behavior is cooperative.      UC Treatments / Results  Labs (all labs ordered are  listed, but only abnormal results are displayed) Labs Reviewed - No data to display  EKG   Radiology No results found.  Procedures Procedures (including critical care time)  Medications Ordered in UC Medications - No data to display  Initial Impression / Assessment and Plan / UC Course  I have reviewed the triage vital signs and the nursing notes.  Pertinent labs & imaging results that were available during my care of the patient were reviewed by me and considered in my medical decision making (see chart for details).    Discussed exam findings and plan of care with patient, strict go to ER precautions given.   Patient verbalized understanding to this provider.  Ddx: Recurrent cold sores, medication refill Final Clinical Impressions(s) / UC Diagnoses   Final diagnoses:  Recurrent cold sores     Discharge Instructions      Take antiviral as directed. Drink plenty of water. Return as needed.     ED Prescriptions     Medication Sig Dispense Auth. Provider   acyclovir (ZOVIRAX) 800 MG tablet  (Status: Discontinued) Take 1 tablet (800 mg total) by mouth 5 (five) times daily for 10 days. 50 tablet Nelma Phagan, NP   acyclovir (ZOVIRAX) 800 MG tablet Take 1 tablet (800 mg total) by mouth 5 (five) times daily for 10 days. 50 tablet Dmarco Baldus, Para March, NP      PDMP not reviewed this encounter.   Clancy Gourd, NP 10/18/23 [phone]

## 2024-03-17 ENCOUNTER — Other Ambulatory Visit: Payer: Self-pay | Admitting: Family Medicine

## 2024-03-17 DIAGNOSIS — Z1231 Encounter for screening mammogram for malignant neoplasm of breast: Secondary | ICD-10-CM

## 2024-03-20 ENCOUNTER — Inpatient Hospital Stay: Admission: RE | Admit: 2024-03-20 | Source: Ambulatory Visit

## 2024-04-04 ENCOUNTER — Ambulatory Visit
Admission: RE | Admit: 2024-04-04 | Discharge: 2024-04-04 | Disposition: A | Discharge status: Home / Self Care | Source: Ambulatory Visit | Attending: Family Medicine | Admitting: Family Medicine

## 2024-04-04 DIAGNOSIS — Z1231 Encounter for screening mammogram for malignant neoplasm of breast: Secondary | ICD-10-CM | POA: Insufficient documentation

## 2024-12-19 ENCOUNTER — Other Ambulatory Visit: Payer: Self-pay | Admitting: Sports Medicine

## 2024-12-19 DIAGNOSIS — N6322 Unspecified lump in the left breast, upper inner quadrant: Secondary | ICD-10-CM

## 2024-12-25 ENCOUNTER — Ambulatory Visit
Admission: RE | Admit: 2024-12-25 | Discharge: 2024-12-25 | Disposition: A | Discharge status: Home / Self Care | Source: Ambulatory Visit | Attending: Sports Medicine | Admitting: Sports Medicine

## 2024-12-25 DIAGNOSIS — N6322 Unspecified lump in the left breast, upper inner quadrant: Secondary | ICD-10-CM
